# Patient Record
Sex: Female | Born: 1965 | Race: Black or African American | Hispanic: No | Marital: Married | Smoking: Never smoker
Health system: Southern US, Community
[De-identification: ages and names within clinical notes are randomized; demographics above are authoritative.]

## PROBLEM LIST (undated history)

## (undated) DIAGNOSIS — I10 Essential (primary) hypertension: Secondary | ICD-10-CM

## (undated) DIAGNOSIS — J45909 Unspecified asthma, uncomplicated: Secondary | ICD-10-CM

---

## 1997-10-04 ENCOUNTER — Other Ambulatory Visit: Admission: RE | Admit: 1997-10-04 | Discharge: 1997-10-04 | Payer: Self-pay | Admitting: *Deleted

## 1998-10-05 ENCOUNTER — Other Ambulatory Visit: Admission: RE | Admit: 1998-10-05 | Discharge: 1998-10-05 | Payer: Self-pay | Admitting: *Deleted

## 2013-09-13 ENCOUNTER — Encounter (HOSPITAL_COMMUNITY): Payer: Self-pay | Admitting: Emergency Medicine

## 2013-09-13 ENCOUNTER — Emergency Department (HOSPITAL_COMMUNITY): Payer: Worker's Compensation

## 2013-09-13 ENCOUNTER — Emergency Department (HOSPITAL_COMMUNITY)
Admission: EM | Admit: 2013-09-13 | Discharge: 2013-09-13 | Disposition: A | Discharge status: Home / Self Care | Payer: Worker's Compensation | Attending: Emergency Medicine | Admitting: Emergency Medicine

## 2013-09-13 DIAGNOSIS — X500XXA Overexertion from strenuous movement or load, initial encounter: Secondary | ICD-10-CM | POA: Diagnosis not present

## 2013-09-13 DIAGNOSIS — S99919A Unspecified injury of unspecified ankle, initial encounter: Secondary | ICD-10-CM | POA: Diagnosis present

## 2013-09-13 DIAGNOSIS — S99929A Unspecified injury of unspecified foot, initial encounter: Secondary | ICD-10-CM

## 2013-09-13 DIAGNOSIS — S93401A Sprain of unspecified ligament of right ankle, initial encounter: Secondary | ICD-10-CM

## 2013-09-13 DIAGNOSIS — Y990 Civilian activity done for income or pay: Secondary | ICD-10-CM

## 2013-09-13 DIAGNOSIS — Y9389 Activity, other specified: Secondary | ICD-10-CM | POA: Diagnosis not present

## 2013-09-13 DIAGNOSIS — Y9289 Other specified places as the place of occurrence of the external cause: Secondary | ICD-10-CM | POA: Diagnosis not present

## 2013-09-13 DIAGNOSIS — J45909 Unspecified asthma, uncomplicated: Secondary | ICD-10-CM | POA: Diagnosis not present

## 2013-09-13 DIAGNOSIS — S8990XA Unspecified injury of unspecified lower leg, initial encounter: Secondary | ICD-10-CM | POA: Insufficient documentation

## 2013-09-13 DIAGNOSIS — I10 Essential (primary) hypertension: Secondary | ICD-10-CM | POA: Insufficient documentation

## 2013-09-13 DIAGNOSIS — Z79899 Other long term (current) drug therapy: Secondary | ICD-10-CM | POA: Diagnosis not present

## 2013-09-13 DIAGNOSIS — S93409A Sprain of unspecified ligament of unspecified ankle, initial encounter: Secondary | ICD-10-CM | POA: Insufficient documentation

## 2013-09-13 HISTORY — DX: Unspecified asthma, uncomplicated: J45.909

## 2013-09-13 HISTORY — DX: Essential (primary) hypertension: I10

## 2013-09-13 MED ORDER — MELOXICAM 7.5 MG PO TABS: 15.0000 mg | ORAL_TABLET | Freq: Every day | ORAL | Status: AC

## 2013-09-13 MED ORDER — OXYCODONE-ACETAMINOPHEN 5-325 MG PO TABS
1.0000 | ORAL_TABLET | Freq: Once | ORAL | Status: DC
  Filled 2013-09-13: qty 1

## 2013-09-13 MED ORDER — OXYCODONE-ACETAMINOPHEN 5-325 MG PO TABS: 1.0000 | ORAL_TABLET | Freq: Four times a day (QID) | ORAL | Status: AC | PRN

## 2013-09-13 MED ORDER — KETOROLAC TROMETHAMINE 60 MG/2ML IM SOLN
60.0000 mg | Freq: Once | INTRAMUSCULAR | Status: AC
  Administered 2013-09-13: 60 mg via INTRAMUSCULAR
  Filled 2013-09-13: qty 2

## 2013-09-13 MED ORDER — OXYCODONE-ACETAMINOPHEN 5-325 MG PO TABS
1.0000 | ORAL_TABLET | Freq: Once | ORAL | Status: AC
  Administered 2013-09-13: 1 via ORAL

## 2013-09-13 NOTE — ED Notes (Signed)
Pt mis-stepped off curb and rolled R ankle. Pt has not tried to bear weight on ankle since. Denies any other injury. No deformity noted

## 2013-09-13 NOTE — Discharge Instructions (Signed)
Acute Ankle Sprain °with Phase I Rehab °An acute ankle sprain is a partial or complete tear in one or more of the ligaments of the ankle due to traumatic injury. The severity of the injury depends on both the number of ligaments sprained and the grade of sprain. There are 3 grades of sprains.  °· A grade 1 sprain is a mild sprain. There is a slight pull without obvious tearing. There is no loss of strength, and the muscle and ligament are the correct length. °· A grade 2 sprain is a moderate sprain. There is tearing of fibers within the substance of the ligament where it connects two bones or two cartilages. The length of the ligament is increased, and there is usually decreased strength. °· A grade 3 sprain is a complete rupture of the ligament and is uncommon. °In addition to the grade of sprain, there are three types of ankle sprains.  °Lateral ankle sprains: This is a sprain of one or more of the three ligaments on the outer side (lateral) of the ankle. These are the most common sprains. °Medial ankle sprains: There is one large triangular ligament of the inner side (medial) of the ankle that is susceptible to injury. Medial ankle sprains are less common. °Syndesmosis, "high ankle," sprains: The syndesmosis is the ligament that connects the two bones of the lower leg. Syndesmosis sprains usually only occur with very severe ankle sprains. °SYMPTOMS °· Pain, tenderness, and swelling in the ankle, starting at the side of injury that may progress to the whole ankle and foot with time. °· "Pop" or tearing sensation at the time of injury. °· Bruising that may spread to the heel. °· Impaired ability to walk soon after injury. °CAUSES  °· Acute ankle sprains are caused by trauma placed on the ankle that temporarily forces or pries the anklebone (talus) out of its normal socket. °· Stretching or tearing of the ligaments that normally hold the joint in place (usually due to a twisting injury). °RISK INCREASES  WITH: °· Previous ankle sprain. °· Sports in which the foot may land awkwardly (i.e., basketball, volleyball, or soccer) or walking or running on uneven or rough surfaces. °· Shoes with inadequate support to prevent sideways motion when stress occurs. °· Poor strength and flexibility. °· Poor balance skills. °· Contact sports. °PREVENTION  °· Warm up and stretch properly before activity. °· Maintain physical fitness: °¨ Ankle and leg flexibility, muscle strength, and endurance. °¨ Cardiovascular fitness. °· Balance training activities. °· Use proper technique and have a coach correct improper technique. °· Taping, protective strapping, bracing, or high-top tennis shoes may help prevent injury. Initially, tape is best; however, it loses most of its support function within 10 to 15 minutes. °· Wear proper-fitted protective shoes (High-top shoes with taping or bracing is more effective than either alone). °· Provide the ankle with support during sports and practice activities for 12 months following injury. °PROGNOSIS  °· If treated properly, ankle sprains can be expected to recover completely; however, the length of recovery depends on the degree of injury. °· A grade 1 sprain usually heals enough in 5 to 7 days to allow modified activity and requires an average of 6 weeks to heal completely. °· A grade 2 sprain requires 6 to 10 weeks to heal completely. °· A grade 3 sprain requires 12 to 16 weeks to heal. °· A syndesmosis sprain often takes more than 3 months to heal. °RELATED COMPLICATIONS  °· Frequent recurrence of symptoms may   result in a chronic problem. Appropriately addressing the problem the first time decreases the frequency of recurrence and optimizes healing time. Severity of the initial sprain does not predict the likelihood of later instability. °· Injury to other structures (bone, cartilage, or tendon). °· A chronically unstable or arthritic ankle joint is a possibility with repeated  sprains. °TREATMENT °Treatment initially involves the use of ice, medication, and compression bandages to help reduce pain and inflammation. Ankle sprains are usually immobilized in a walking cast or boot to allow for healing. Crutches may be recommended to reduce pressure on the injury. After immobilization, strengthening and stretching exercises may be necessary to regain strength and a full range of motion. Surgery is rarely needed to treat ankle sprains. °MEDICATION  °· Nonsteroidal anti-inflammatory medications, such as aspirin and ibuprofen (do not take for the first 3 days after injury or within 7 days before surgery), or other minor pain relievers, such as acetaminophen, are often recommended. Take these as directed by your caregiver. Contact your caregiver immediately if any bleeding, stomach upset, or signs of an allergic reaction occur from these medications. °· Ointments applied to the skin may be helpful. °· Pain relievers may be prescribed as necessary by your caregiver. Do not take prescription pain medication for longer than 4 to 7 days. Use only as directed and only as much as you need. °HEAT AND COLD °· Cold treatment (icing) is used to relieve pain and reduce inflammation for acute and chronic cases. Cold should be applied for 10 to 15 minutes every 2 to 3 hours for inflammation and pain and immediately after any activity that aggravates your symptoms. Use ice packs or an ice massage. °· Heat treatment may be used before performing stretching and strengthening activities prescribed by your caregiver. Use a heat pack or a warm soak. °SEEK IMMEDIATE MEDICAL CARE IF:  °· Pain, swelling, or bruising worsens despite treatment. °· You experience pain, numbness, discoloration, or coldness in the foot or toes. °· New, unexplained symptoms develop (drugs used in treatment may produce side effects.) °EXERCISES  °PHASE I EXERCISES      Hold stretches for 20-30 seconds, repeat 2-3 times each, perform  exercises 2-3 times a day.  ° °RANGE OF MOTION (ROM) AND STRETCHING EXERCISES - Ankle Sprain, Acute Phase I, Weeks 1 to 2 °These exercises may help you when beginning to restore flexibility in your ankle. You will likely work on these exercises for the 1 to 2 weeks after your injury. Once your physician, physical therapist, or athletic trainer sees adequate progress, he or she will advance your exercises. While completing these exercises, remember:  °· Restoring tissue flexibility helps normal motion to return to the joints. This allows healthier, less painful movement and activity. °· An effective stretch should be held for at least 30 seconds. °· A stretch should never be painful. You should only feel a gentle lengthening or release in the stretched tissue. °RANGE OF MOTION - Dorsi/Plantar Flexion °· While sitting with your right / left knee straight, draw the top of your foot upwards by flexing your ankle. Then reverse the motion, pointing your toes downward. °· Hold each position for __________ seconds. °· After completing your first set of exercises, repeat this exercise with your knee bent. °Repeat __________ times. Complete this exercise __________ times per day.  °RANGE OF MOTION - Ankle Alphabet °· Imagine your right / left big toe is a pen. °· Keeping your hip and knee still, write out the entire alphabet   with your "pen." Make the letters as large as you can without increasing any discomfort. °Repeat __________ times. Complete this exercise __________ times per day.  °STRENGTHENING EXERCISES - Ankle Sprain, Acute -Phase I, Weeks 1 to 2 °These exercises may help you when beginning to restore strength in your ankle. You will likely work on these exercises for 1 to 2 weeks after your injury. Once your physician, physical therapist, or athletic trainer sees adequate progress, he or she will advance your exercises. While completing these exercises, remember:  °· Muscles can gain both the endurance and the  strength needed for everyday activities through controlled exercises. °· Complete these exercises as instructed by your physician, physical therapist, or athletic trainer. Progress the resistance and repetitions only as guided. °· You may experience muscle soreness or fatigue, but the pain or discomfort you are trying to eliminate should never worsen during these exercises. If this pain does worsen, stop and make certain you are following the directions exactly. If the pain is still present after adjustments, discontinue the exercise until you can discuss the trouble with your clinician. °STRENGTH - Dorsiflexors °· Secure a rubber exercise band/tubing to a fixed object (i.e., table, pole) and loop the other end around your right / left foot. °· Sit on the floor facing the fixed object. The band/tubing should be slightly tense when your foot is relaxed. °· Slowly draw your foot back toward you using your ankle and toes. °· Hold this position for __________ seconds. Slowly release the tension in the band and return your foot to the starting position. °Repeat __________ times. Complete this exercise __________ times per day.  °STRENGTH - Plantar-flexors  °· Sit with your right / left leg extended. Holding onto both ends of a rubber exercise band/tubing, loop it around the ball of your foot. Keep a slight tension in the band. °· Slowly push your toes away from you, pointing them downward. °· Hold this position for __________ seconds. Return slowly, controlling the tension in the band/tubing. °Repeat __________ times. Complete this exercise __________ times per day.  °STRENGTH - Ankle Eversion °· Secure one end of a rubber exercise band/tubing to a fixed object (table, pole). Loop the other end around your foot just before your toes. °· Place your fists between your knees. This will focus your strengthening at your ankle. °· Drawing the band/tubing across your opposite foot, slowly, pull your little toe out and up. Make  sure the band/tubing is positioned to resist the entire motion. °· Hold this position for __________ seconds. °Have your muscles resist the band/tubing as it slowly pulls your foot back to the starting position.  °Repeat __________ times. Complete this exercise __________ times per day.  °STRENGTH - Ankle Inversion °· Secure one end of a rubber exercise band/tubing to a fixed object (table, pole). Loop the other end around your foot just before your toes. °· Place your fists between your knees. This will focus your strengthening at your ankle. °· Slowly, pull your big toe up and in, making sure the band/tubing is positioned to resist the entire motion. °· Hold this position for __________ seconds. °· Have your muscles resist the band/tubing as it slowly pulls your foot back to the starting position. °Repeat __________ times. Complete this exercises __________ times per day.  °STRENGTH - Towel Curls °· Sit in a chair positioned on a non-carpeted surface. °· Place your right / left foot on a towel, keeping your heel on the floor. °· Pull the towel   toward your heel by only curling your toes. Keep your heel on the floor. °· If instructed by your physician, physical therapist, or athletic trainer, add weight to the end of the towel. °Repeat __________ times. Complete this exercise __________ times per day. °Document Released: 08/28/2004 Document Revised: 06/13/2013 Document Reviewed: 05/11/2008 °[employer]® Patient Information ©2015 [employer]. This information is not intended to replace advice given to you by your health care provider. Make sure you discuss any questions you have with your health care provider. ° °

## 2013-09-13 NOTE — ED Provider Notes (Signed)
CSN: [id number]     Arrival date & time 09/13/13  1807 History  This chart was scribed for non-physician practitioner, Junius FinnerErin O'Malley, PA-C,working with Rolland PorterMark James, MD, by Karle PlumberJennifer Tensley, ED Scribe. This patient was seen in room WTR5/WTR5 and the patient's care was started at 6:55 PM.  Chief Complaint  Patient presents with  . Ankle Pain   The history is provided by the patient. No language interpreter was used.   HPI Comments:  Brianna Buchanan is a 48 y.o. morbidly obese female brought in by EMS, who presents to the Emergency Department complaining of severe right ankle pain secondary to stepping off a curb and rolling the ankle. She reports severe medial pain and moderate lateral swelling. She reports associated tingling of the toes. She denies numbness, weakness, knee pain, hip pain, LOC or head injury. Incident occurred while pt was at work. Denies previous injury to same ankle.  Past Medical History  Diagnosis Date  . Hypertension   . Asthma    History reviewed. No pertinent past surgical history. History reviewed. No pertinent family history. History  Substance Use Topics  . Smoking status: Never Smoker   . Smokeless tobacco: Not on file  . Alcohol Use: No   OB History   Grav Para Term Preterm Abortions TAB SAB Ect Mult Living                 Review of Systems  Musculoskeletal: Positive for arthralgias and joint swelling.  Neurological: Negative for syncope, weakness and numbness.  All other systems reviewed and are negative.   Allergies  Review of patient's allergies indicates no known allergies.  Home Medications   Prior to Admission medications   Medication Sig Start Date End Date Taking? Authorizing Provider  meloxicam (MOBIC) 7.5 MG tablet Take 2 tablets (15 mg total) by mouth daily. 09/13/13   Junius FinnerErin O'Malley, PA-C  oxyCODONE-acetaminophen (PERCOCET/ROXICET) 5-325 MG per tablet Take 1-2 tablets by mouth every 6 (six) hours as needed for moderate pain or severe pain.  09/13/13   Junius FinnerErin O'Malley, PA-C   Triage Vitals: BP 145/56  Pulse 91  Temp(Src) 97.5 F (36.4 C) (Oral)  Resp 20  SpO2 100% Physical Exam  Nursing note and vitals reviewed. Constitutional: She is oriented to person, place, and time. She appears well-developed and well-nourished.  Morbidly obese.  HENT:  Head: Normocephalic and atraumatic.  Eyes: EOM are normal.  Neck: Normal range of motion.  Cardiovascular: Normal rate.   Pulses:      Dorsalis pedis pulses are 2+ on the right side.       Posterior tibial pulses are 2+ on the right side.  Pulmonary/Chest: Effort normal.  Musculoskeletal: Normal range of motion. She exhibits edema and tenderness.  Moderate edema inferior to lateral malleolus with tenderness. Tenderness to medial malleolus without edema. Limited ROM of ankle due to pain. Sensation intact.  Neurological: She is alert and oriented to person, place, and time.  Skin: Skin is warm and dry.  Skin intact. No ecchymosis or erythema.  Psychiatric: She has a normal mood and affect. Her behavior is normal.    ED Course  Procedures   SPLINT APPLICATION Date/Time: 7:24 PM Authorized by: Junius Finner'MALLEY, Artemis Loyal A. Consent: Verbal consent obtained. Risks and benefits: risks, benefits and alternatives were discussed Consent given by: patient Splint applied by: orthopedic technician Location details: right ankle Splint type: ASO Supplies used: ASO Post-procedure: The splinted body part was neurovascularly unchanged following the procedure. Patient tolerance: Patient tolerated the procedure  well with no immediate complications.   DIAGNOSTIC STUDIES: Oxygen Saturation is 100% on RA, normal by my interpretation.   COORDINATION OF CARE: 6:59 PM- Will order ASO splint, provide crutches, and prescribe pain medication. Advised pt to RICE area. Will give Toradol and Percocet prior to discharge. Pt verbalizes understanding and agrees to plan.  Medications  oxyCODONE-acetaminophen  (PERCOCET/ROXICET) 5-325 MG per tablet 1 tablet (not administered)  ketorolac (TORADOL) injection 60 mg (not administered)  oxyCODONE-acetaminophen (PERCOCET/ROXICET) 5-325 MG per tablet 1 tablet (not administered)    Labs Review Labs Reviewed - No data to display  Imaging Review Dg Ankle Complete Right  09/13/2013   CLINICAL DATA:  Fall.  Ankle pain  EXAM: RIGHT ANKLE - COMPLETE 3+ VIEW  COMPARISON:  None.  FINDINGS: Multiple small calcifications in soft tissues inferior to the medial malleolus. This is probably chronic ligament calcification due to old injury. No soft tissue swelling in the area. Correlate with pain. Lateral soft tissue swelling without fracture of the fibula.  IMPRESSION: Soft tissue calcification medial ankle likely due to old injury. No acute fracture of the fibula.   Electronically Signed   By: Marlan Palau M.D.   On: 09/13/2013 18:50     EKG Interpretation None      MDM   Final diagnoses:  Right ankle sprain, initial encounter  Work place accident    Pt presenting to ED with c/o right ankle pain and swelling after tripping on curb at work.  Denies other injury.  Right foot is neurovascularly in tact.  Plain films: no acute fracture.  Will tx as sprain.  ASO applied and crutches given. Rx: percocet and mobic. Advised to f/u with orthopedics in 1-2 weeks for recheck of symptoms. Home care instructions provided. Return precautions provided. Pt verbalized understanding and agreement with tx plan.   I personally performed the services described in this documentation, which was scribed in my presence. The recorded information has been reviewed and is accurate.    Junius Finner, PA-C 09/13/13 1925

## 2013-09-13 NOTE — Progress Notes (Signed)
Orthopedic Tech Progress Note Patient Details:  Brianna Buchanan 01-22-1966 [id number] Applied ASO to RLE.  Pulses, motion, sensation intact before and after application.  Capillary refill less than 2 seconds before and after application.  Fitted crutches and taught pt. use of same. Ortho Devices Type of Ortho Device: ASO;Crutches Ortho Device/Splint Interventions: Application   [name], [name] 09/13/2013, 7:28 PM

## 2013-09-13 NOTE — ED Notes (Signed)
Urine Drug Screen completed at this time.

## 2013-09-13 NOTE — ED Notes (Signed)
Bed: WTR5 Expected date:  Expected time:  Means of arrival:  Comments: EMS-ankle injury

## 2013-09-15 NOTE — ED Provider Notes (Signed)
Medical screening examination/treatment/procedure(s) were performed by non-physician practitioner and as supervising physician I was immediately available for consultation/collaboration.   EKG Interpretation None        Clella Mckeel, MD 09/15/13 0821 

## 2013-10-20 ENCOUNTER — Ambulatory Visit: Payer: Worker's Compensation | Attending: Internal Medicine | Admitting: Physical Therapy

## 2013-10-20 DIAGNOSIS — IMO0001 Reserved for inherently not codable concepts without codable children: Secondary | ICD-10-CM | POA: Insufficient documentation

## 2013-10-20 DIAGNOSIS — M25676 Stiffness of unspecified foot, not elsewhere classified: Secondary | ICD-10-CM | POA: Insufficient documentation

## 2013-10-20 DIAGNOSIS — R269 Unspecified abnormalities of gait and mobility: Secondary | ICD-10-CM | POA: Insufficient documentation

## 2013-10-20 DIAGNOSIS — I10 Essential (primary) hypertension: Secondary | ICD-10-CM | POA: Insufficient documentation

## 2013-10-20 DIAGNOSIS — M25579 Pain in unspecified ankle and joints of unspecified foot: Secondary | ICD-10-CM | POA: Diagnosis not present

## 2013-10-20 DIAGNOSIS — M25673 Stiffness of unspecified ankle, not elsewhere classified: Secondary | ICD-10-CM | POA: Diagnosis not present

## 2013-10-20 DIAGNOSIS — J45909 Unspecified asthma, uncomplicated: Secondary | ICD-10-CM | POA: Insufficient documentation

## 2013-10-25 ENCOUNTER — Ambulatory Visit: Payer: Worker's Compensation | Admitting: Physical Therapy

## 2013-10-26 ENCOUNTER — Ambulatory Visit: Payer: Worker's Compensation | Admitting: Physical Therapy

## 2013-10-31 ENCOUNTER — Ambulatory Visit: Payer: Worker's Compensation | Admitting: Physical Therapy

## 2013-11-02 ENCOUNTER — Ambulatory Visit: Payer: Worker's Compensation | Admitting: Rehabilitation

## 2013-11-07 ENCOUNTER — Ambulatory Visit: Payer: Worker's Compensation | Admitting: Physical Therapy

## 2013-11-09 ENCOUNTER — Ambulatory Visit: Payer: Worker's Compensation | Admitting: Rehabilitation

## 2013-11-14 ENCOUNTER — Ambulatory Visit: Payer: Worker's Compensation | Admitting: Physical Therapy

## 2013-11-16 ENCOUNTER — Ambulatory Visit: Payer: Worker's Compensation | Admitting: Rehabilitation

## 2013-11-21 ENCOUNTER — Ambulatory Visit: Payer: Worker's Compensation | Admitting: Physical Therapy

## 2013-11-23 ENCOUNTER — Ambulatory Visit: Payer: Worker's Compensation | Admitting: Rehabilitation

## 2013-11-28 ENCOUNTER — Ambulatory Visit: Payer: Worker's Compensation | Admitting: Physical Therapy

## 2013-11-30 ENCOUNTER — Ambulatory Visit: Payer: Worker's Compensation | Admitting: Rehabilitation

## 2016-06-22 IMAGING — CR DG ANKLE COMPLETE 3+V*R*
3 series · 3 of 3 positions shown · non-contrast
Comparison: None.

CLINICAL DATA: Fall.  Ankle pain

EXAM:
RIGHT ANKLE - COMPLETE 3+ VIEW

[x ankle ap right]
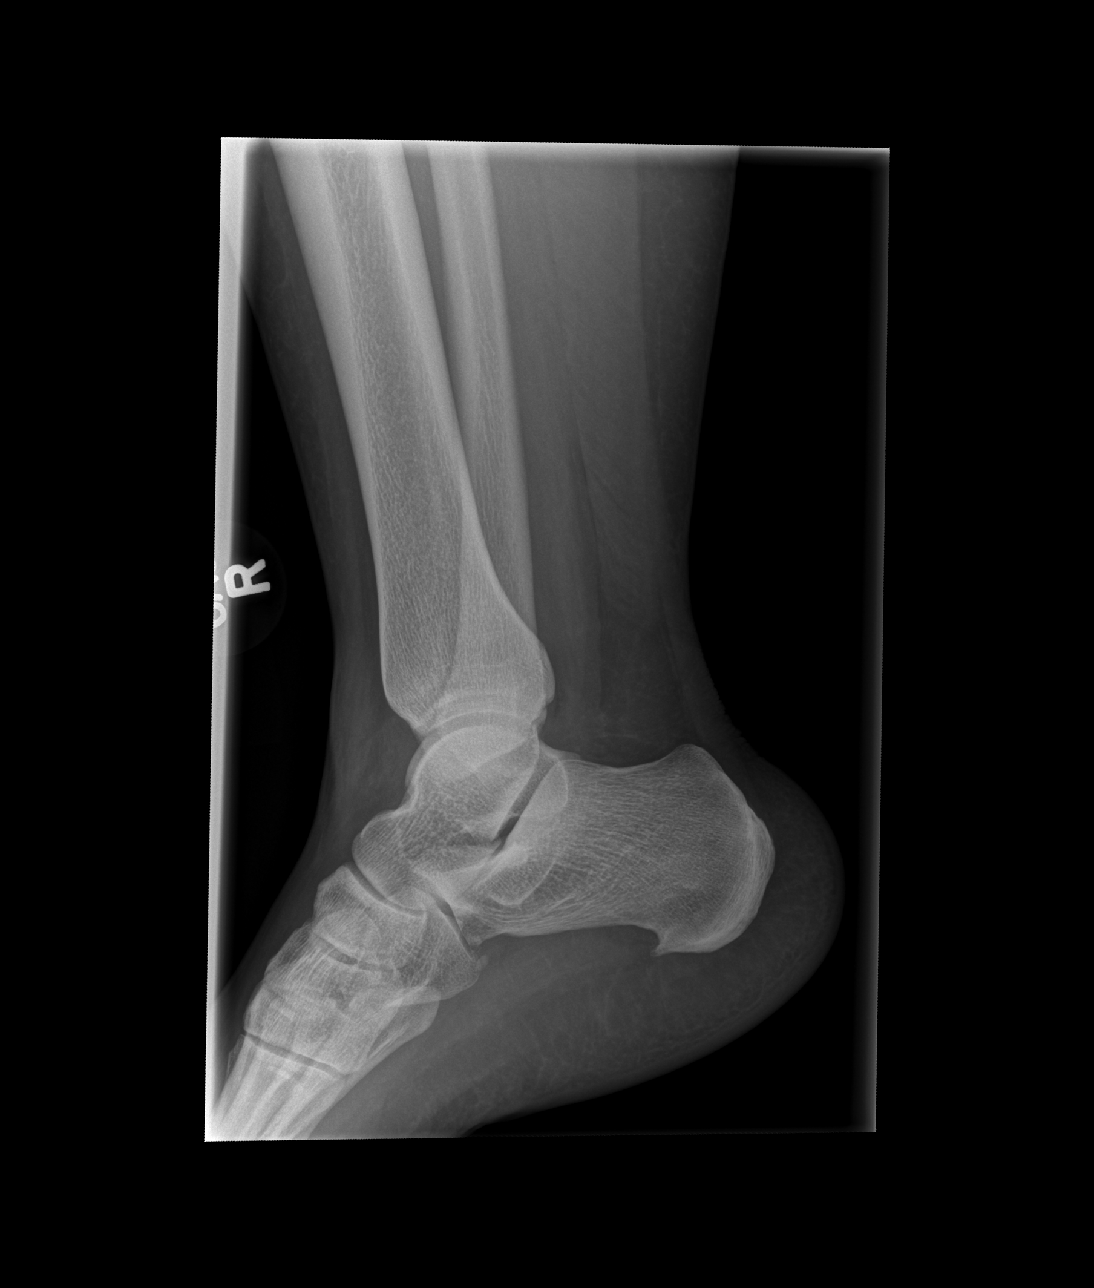

[x ankle obl right]
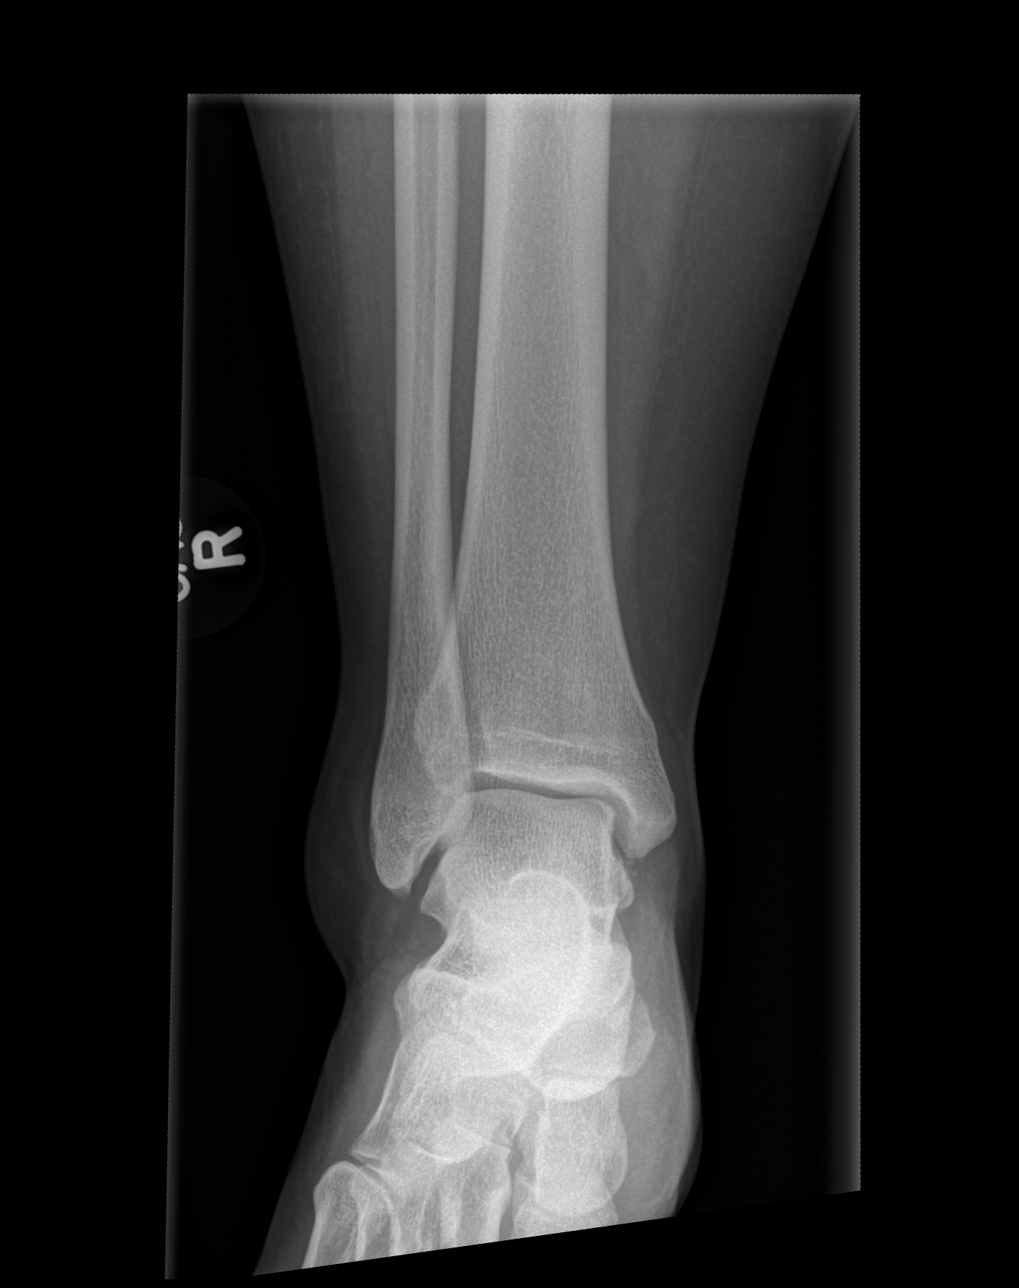

[x ankle lat right]
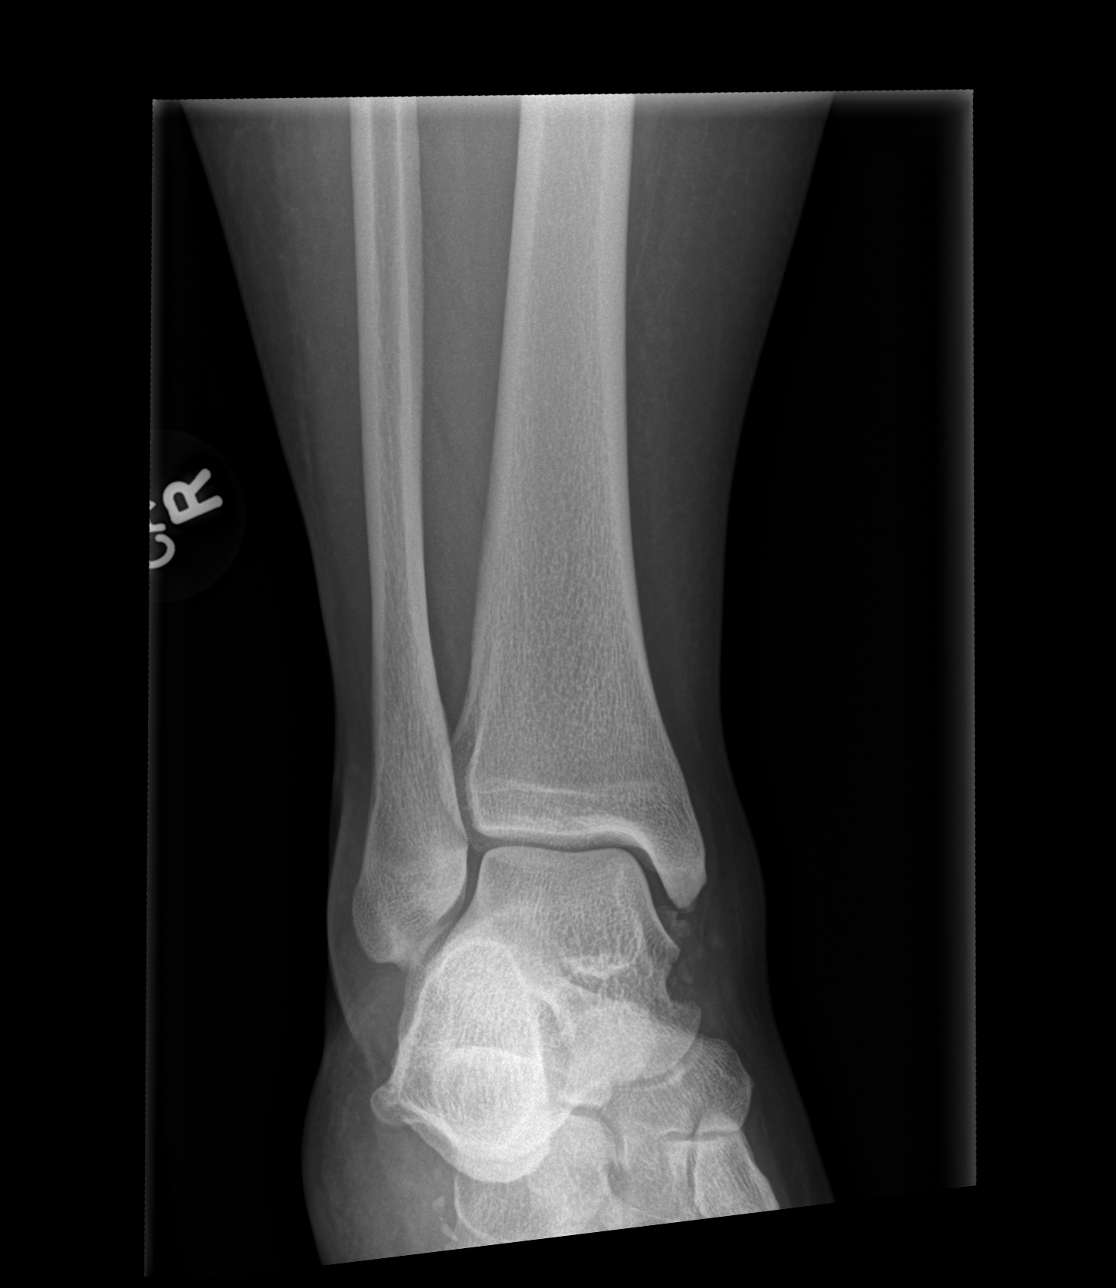

[3 of 3 positions shown; findings below may reference images not displayed]

FINDINGS: Multiple small calcifications in soft tissues inferior to the medial
malleolus. This is probably chronic ligament calcification due to
old injury. No soft tissue swelling in the area. Correlate with
pain. Lateral soft tissue swelling without fracture of the fibula.
IMPRESSION: Soft tissue calcification medial ankle likely due to old injury. No
acute fracture of the fibula.

## 2020-01-09 ENCOUNTER — Ambulatory Visit: Payer: Self-pay | Attending: Internal Medicine

## 2020-01-09 DIAGNOSIS — Z23 Encounter for immunization: Secondary | ICD-10-CM

## 2020-01-09 NOTE — Progress Notes (Signed)
   Covid-19 Vaccination Clinic  Name:  Brianna Buchanan    MRN: [id number] DOB: 02-12-1965  01/09/2020  Brianna Buchanan was observed post Covid-19 immunization for 15 minutes without incident. She was provided with Vaccine Information Sheet and instruction to access the V-Safe system.   Brianna Buchanan was instructed to call 911 with any severe reactions post vaccine: Marland Kitchen Difficulty breathing  . Swelling of face and throat  . A fast heartbeat  . A bad rash all over body  . Dizziness and weakness   Immunizations Administered    No immunizations on file.

## 2022-09-21 ENCOUNTER — Other Ambulatory Visit: Payer: Self-pay

## 2022-09-21 ENCOUNTER — Emergency Department (HOSPITAL_BASED_OUTPATIENT_CLINIC_OR_DEPARTMENT_OTHER): Payer: BC Managed Care – PPO

## 2022-09-21 ENCOUNTER — Encounter (HOSPITAL_BASED_OUTPATIENT_CLINIC_OR_DEPARTMENT_OTHER): Payer: Self-pay | Admitting: Emergency Medicine

## 2022-09-21 DIAGNOSIS — I10 Essential (primary) hypertension: Secondary | ICD-10-CM | POA: Diagnosis not present

## 2022-09-21 DIAGNOSIS — J40 Bronchitis, not specified as acute or chronic: Secondary | ICD-10-CM | POA: Insufficient documentation

## 2022-09-21 DIAGNOSIS — R059 Cough, unspecified: Secondary | ICD-10-CM | POA: Diagnosis present

## 2022-09-21 DIAGNOSIS — Z20822 Contact with and (suspected) exposure to covid-19: Secondary | ICD-10-CM | POA: Diagnosis not present

## 2022-09-21 DIAGNOSIS — J45909 Unspecified asthma, uncomplicated: Secondary | ICD-10-CM | POA: Insufficient documentation

## 2022-09-21 LAB — RESP PANEL BY RT-PCR (RSV, FLU A&B, COVID)  RVPGX2
Influenza A by PCR: NEGATIVE
Influenza B by PCR: NEGATIVE
Resp Syncytial Virus by PCR: NEGATIVE
SARS Coronavirus 2 by RT PCR: NEGATIVE

## 2022-09-21 NOTE — ED Triage Notes (Signed)
Pt c/o cough (now productive) x 1 wk; hx asthma, using neb at home

## 2022-09-22 ENCOUNTER — Emergency Department (HOSPITAL_BASED_OUTPATIENT_CLINIC_OR_DEPARTMENT_OTHER)
Admission: EM | Admit: 2022-09-22 | Discharge: 2022-09-22 | Disposition: A | Discharge status: Home / Self Care | Payer: BC Managed Care – PPO | Attending: Emergency Medicine | Admitting: Emergency Medicine

## 2022-09-22 DIAGNOSIS — J40 Bronchitis, not specified as acute or chronic: Secondary | ICD-10-CM

## 2022-09-22 MED ORDER — PREDNISONE 50 MG PO TABS
60.0000 mg | ORAL_TABLET | Freq: Once | ORAL | Status: AC
  Administered 2022-09-22: 60 mg via ORAL
  Filled 2022-09-22: qty 1

## 2022-09-22 MED ORDER — PREDNISONE 10 MG (21) PO TBPK: ORAL_TABLET | ORAL | 0 refills | Status: AC

## 2022-09-22 NOTE — ED Provider Notes (Signed)
[facility]  Provider Note  CSN: [id number] Arrival date & time: 09/21/22 2102  History Chief Complaint  Patient presents with   Cough    Brianna Buchanan is a 57 y.o. female with history of asthma and HTN reports about a week of URI symptoms and dry cough. She reports some improvement with a neb earlier. Denies fever.    Home Medications Prior to Admission medications   Medication Sig Start Date End Date Taking? Authorizing Provider  predniSONE (STERAPRED UNI-PAK 21 TAB) 10 MG (21) TBPK tablet 10mg  Tabs, 6 day taper. Use as directed 09/22/22  Yes Pollyann Savoy, MD  meloxicam (MOBIC) 7.5 MG tablet Take 2 tablets (15 mg total) by mouth daily. 09/13/13   Lurene Shadow, PA-C  oxyCODONE-acetaminophen (PERCOCET/ROXICET) 5-325 MG per tablet Take 1-2 tablets by mouth every 6 (six) hours as needed for moderate pain or severe pain. 09/13/13   Lurene Shadow, PA-C     Allergies    Mucinex [guaifenesin er]   Review of Systems   Review of Systems Please see HPI for pertinent positives and negatives  Physical Exam BP (!) 165/67   Pulse 89   Temp (!) 96.8 F (36 C)   Resp 18   Ht 5\' 4"  (1.626 m)   Wt (!) 145.2 kg   SpO2 100%   BMI 54.93 kg/m   Physical Exam Vitals and nursing note reviewed.  Constitutional:      Appearance: Normal appearance.  HENT:     Head: Normocephalic and atraumatic.     Nose: Nose normal.     Mouth/Throat:     Mouth: Mucous membranes are moist.  Eyes:     Extraocular Movements: Extraocular movements intact.     Conjunctiva/sclera: Conjunctivae normal.  Cardiovascular:     Rate and Rhythm: Normal rate.  Pulmonary:     Effort: Pulmonary effort is normal.     Breath sounds: Normal breath sounds. No wheezing or rales.  Abdominal:     General: Abdomen is flat.     Palpations: Abdomen is soft.     Tenderness: There is no abdominal tenderness.  Musculoskeletal:        General: No swelling. Normal range of  motion.     Cervical back: Neck supple.  Skin:    General: Skin is warm and dry.  Neurological:     General: No focal deficit present.     Mental Status: She is alert.  Psychiatric:        Mood and Affect: Mood normal.     ED Results / Procedures / Treatments   EKG None  Procedures Procedures  Medications Ordered in the ED Medications  predniSONE (DELTASONE) tablet 60 mg (has no administration in time range)    Initial Impression and Plan  Patient here with viral URI, well appearing, reassuring vitals and exam. Covid/Flu/RSV swab is neg. I personally viewed the images from radiology studies and agree with radiologist interpretation: CXR is clear. Likely a viral bronchitis, recommend she continue with symptomatic meds including nebs at home. Rx for prednisone. Advised she may have a post-viral cough for several weeks. PCP follow up, RTED for any other concerns.    ED Course       MDM Rules/Calculators/A&P Medical Decision Making Problems Addressed: Bronchitis: acute illness or injury  Amount and/or Complexity of Data Reviewed Labs: ordered. Decision-making details documented in ED Course. Radiology: ordered and independent interpretation performed. Decision-making details documented in ED  Course.  Risk Prescription drug management.     Final Clinical Impression(s) / ED Diagnoses Final diagnoses:  Bronchitis    Rx / DC Orders ED Discharge Orders          Ordered    predniSONE (STERAPRED UNI-PAK 21 TAB) 10 MG (21) TBPK tablet        09/22/22 0154             Pollyann Savoy, MD 09/22/22 [phone]
